# Patient Record
Sex: Male | Born: 1995 | Race: White | Hispanic: Yes
Health system: Southern US, Community
[De-identification: ages and names within clinical notes are randomized; demographics above are authoritative.]

---

## 2020-06-16 ENCOUNTER — Emergency Department (HOSPITAL_COMMUNITY): Payer: No Typology Code available for payment source

## 2020-06-16 ENCOUNTER — Emergency Department (HOSPITAL_COMMUNITY)
Admission: EM | Admit: 2020-06-16 | Discharge: 2020-06-16 | Disposition: A | Discharge status: Home / Self Care | Payer: No Typology Code available for payment source | Attending: Emergency Medicine | Admitting: Emergency Medicine

## 2020-06-16 DIAGNOSIS — S92301A Fracture of unspecified metatarsal bone(s), right foot, initial encounter for closed fracture: Secondary | ICD-10-CM

## 2020-06-16 DIAGNOSIS — Z23 Encounter for immunization: Secondary | ICD-10-CM | POA: Insufficient documentation

## 2020-06-16 DIAGNOSIS — R52 Pain, unspecified: Secondary | ICD-10-CM

## 2020-06-16 DIAGNOSIS — Y999 Unspecified external cause status: Secondary | ICD-10-CM | POA: Insufficient documentation

## 2020-06-16 DIAGNOSIS — S93124A Dislocation of metatarsophalangeal joint of right lesser toe(s), initial encounter: Secondary | ICD-10-CM | POA: Insufficient documentation

## 2020-06-16 DIAGNOSIS — S91312A Laceration without foreign body, left foot, initial encounter: Secondary | ICD-10-CM | POA: Diagnosis not present

## 2020-06-16 DIAGNOSIS — T1490XA Injury, unspecified, initial encounter: Secondary | ICD-10-CM

## 2020-06-16 DIAGNOSIS — Y939 Activity, unspecified: Secondary | ICD-10-CM | POA: Insufficient documentation

## 2020-06-16 DIAGNOSIS — Y929 Unspecified place or not applicable: Secondary | ICD-10-CM | POA: Diagnosis not present

## 2020-06-16 DIAGNOSIS — S92901A Unspecified fracture of right foot, initial encounter for closed fracture: Secondary | ICD-10-CM

## 2020-06-16 DIAGNOSIS — S93104A Unspecified dislocation of right toe(s), initial encounter: Secondary | ICD-10-CM

## 2020-06-16 DIAGNOSIS — S99912A Unspecified injury of left ankle, initial encounter: Secondary | ICD-10-CM | POA: Diagnosis present

## 2020-06-16 LAB — COMPREHENSIVE METABOLIC PANEL
ALT: 43 U/L (ref 0–44)
AST: 72 U/L — ABNORMAL HIGH (ref 15–41)
Albumin: 3.9 g/dL (ref 3.5–5.0)
Alkaline Phosphatase: 93 U/L (ref 38–126)
Anion gap: 12 (ref 5–15)
BUN: 12 mg/dL (ref 6–20)
CO2: 22 mmol/L (ref 22–32)
Calcium: 8.5 mg/dL — ABNORMAL LOW (ref 8.9–10.3)
Chloride: 105 mmol/L (ref 98–111)
Creatinine, Ser: 1.1 mg/dL (ref 0.61–1.24)
GFR calc Af Amer: >60 mL/min (ref >60)
GFR calc non Af Amer: >60 mL/min (ref >60)
Glucose, Bld: 105 mg/dL — ABNORMAL HIGH (ref 70–99)
Potassium: 3.6 mmol/L (ref 3.5–5.1)
Sodium: 139 mmol/L (ref 135–145)
Total Bilirubin: 0.6 mg/dL (ref 0.3–1.2)
Total Protein: 6.6 g/dL (ref 6.5–8.1)

## 2020-06-16 LAB — PROTIME-INR
INR: 1.1 (ref 0.8–1.2)
Prothrombin Time: 13.3 seconds (ref 11.4–15.2)

## 2020-06-16 LAB — CBC
HCT: 38.6 % — ABNORMAL LOW (ref 39.0–52.0)
Hemoglobin: 13.3 g/dL (ref 13.0–17.0)
MCH: 31.7 pg (ref 26.0–34.0)
MCHC: 34.5 g/dL (ref 30.0–36.0)
MCV: 92.1 fL (ref 80.0–100.0)
Platelets: 169 10*3/uL (ref 150–400)
RBC: 4.19 MIL/uL — ABNORMAL LOW (ref 4.22–5.81)
RDW: 11.9 % (ref 11.5–15.5)
WBC: 16.6 10*3/uL — ABNORMAL HIGH (ref 4.0–10.5)
nRBC: 0 % (ref 0.0–0.2)

## 2020-06-16 LAB — SAMPLE TO BLOOD BANK

## 2020-06-16 LAB — ETHANOL: Alcohol, Ethyl (B): 182 mg/dL — ABNORMAL HIGH (ref <10)

## 2020-06-16 MED ORDER — LIDOCAINE HCL (PF) 1 % IJ SOLN
30.0000 mL | Freq: Once | INTRAMUSCULAR | Status: AC
  Administered 2020-06-16: 30 mL
  Filled 2020-06-16: qty 30

## 2020-06-16 MED ORDER — IBUPROFEN 400 MG PO TABS
400.0000 mg | ORAL_TABLET | Freq: Once | ORAL | Status: AC
  Administered 2020-06-16: 400 mg via ORAL
  Filled 2020-06-16: qty 1

## 2020-06-16 MED ORDER — HYDROCODONE-ACETAMINOPHEN 5-325 MG PO TABS: 1.0000 | ORAL_TABLET | Freq: Four times a day (QID) | ORAL | 0 refills | Status: AC | PRN

## 2020-06-16 MED ORDER — FENTANYL CITRATE (PF) 100 MCG/2ML IJ SOLN
100.0000 ug | Freq: Once | INTRAMUSCULAR | Status: AC
  Administered 2020-06-16: 100 ug via INTRAVENOUS
  Filled 2020-06-16: qty 2

## 2020-06-16 MED ORDER — TETANUS-DIPHTH-ACELL PERTUSSIS 5-2.5-18.5 LF-MCG/0.5 IM SUSP
0.5000 mL | Freq: Once | INTRAMUSCULAR | Status: AC
  Administered 2020-06-16: 0.5 mL via INTRAMUSCULAR
  Filled 2020-06-16: qty 0.5

## 2020-06-16 MED ORDER — HYDROCODONE-ACETAMINOPHEN 5-325 MG PO TABS
1.0000 | ORAL_TABLET | Freq: Once | ORAL | Status: AC
  Administered 2020-06-16: 1 via ORAL
  Filled 2020-06-16: qty 1

## 2020-06-16 MED ORDER — IBUPROFEN 400 MG PO TABS: 400.0000 mg | ORAL_TABLET | Freq: Four times a day (QID) | ORAL | 0 refills | Status: AC | PRN

## 2020-06-16 MED ORDER — LIDOCAINE-EPINEPHRINE 1 %-1:100000 IJ SOLN
20.0000 mL | Freq: Once | INTRAMUSCULAR | Status: AC
  Administered 2020-06-16: 1 mL
  Filled 2020-06-16: qty 1

## 2020-06-16 NOTE — ED Provider Notes (Signed)
Blood pressure 122/70, pulse 99, temperature 99.2 F (37.3 C), temperature source Oral, resp. rate 14, SpO2 97 %.  Assuming care from Dr. Bebe Shaggy.  In short, Kenneth Walton is a 24 y.o. male with a chief complaint of No chief complaint on file. Marland Kitchen  Refer to the original H&P for additional details.  The current plan of care is to f/u on left foot plain film.  07:52 AM  Left foot plain film reviewed independently and Radiology interpretation reviewed. No acute findings. Plan for discharge with plan per Dr. Bebe Shaggy.     Maia Plan, MD 06/16/20 [phone]
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
## 2020-06-16 NOTE — ED Notes (Signed)
This RN received pt at 0300 from [employer]. I am unable to chart any information prior to 0300

## 2020-06-16 NOTE — Discharge Instructions (Signed)
Keep your splint clean and dry. Follow with the orthopedic surgery team in the coming week. Call tomorrow to schedule an appointment. Return to the ED with any new or suddenly worsening symptoms.

## 2020-06-16 NOTE — ED Notes (Signed)
Mother coming from [place] to pick him up

## 2020-06-16 NOTE — Progress Notes (Signed)
Orthopedic Tech Progress Note Patient Details:  Gohan Collister Dela Delma Officer [birth year] [id number]  Ortho Devices Type of Ortho Device: Crutches, Post (short leg) splint Ortho Device/Splint Location: rle Ortho Device/Splint Interventions: Ordered, Application, Adjustment   Post Interventions Instructions Provided: Care of device, Adjustment of device   Trinna Post 06/16/2020, 6:37 AM

## 2020-06-16 NOTE — ED Notes (Signed)
CBC is clotted and needs to be recollected per lab.

## 2020-06-21 ENCOUNTER — Encounter: Payer: Self-pay | Admitting: Orthopedic Surgery

## 2020-06-21 ENCOUNTER — Ambulatory Visit (INDEPENDENT_AMBULATORY_CARE_PROVIDER_SITE_OTHER): Payer: Self-pay

## 2020-06-21 ENCOUNTER — Ambulatory Visit (INDEPENDENT_AMBULATORY_CARE_PROVIDER_SITE_OTHER): Payer: Self-pay | Admitting: Orthopedic Surgery

## 2020-06-21 VITALS — Ht 69.0 in | Wt 135.0 lb

## 2020-06-21 DIAGNOSIS — M79671 Pain in right foot: Secondary | ICD-10-CM

## 2020-06-21 DIAGNOSIS — M25551 Pain in right hip: Secondary | ICD-10-CM
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
## 2020-07-05 ENCOUNTER — Ambulatory Visit (INDEPENDENT_AMBULATORY_CARE_PROVIDER_SITE_OTHER): Payer: Self-pay | Admitting: Surgical

## 2020-07-05 ENCOUNTER — Ambulatory Visit: Payer: Self-pay

## 2020-07-05 DIAGNOSIS — M79671 Pain in right foot: Secondary | ICD-10-CM

## 2020-07-05 DIAGNOSIS — S92301A Fracture of unspecified metatarsal bone(s), right foot, initial encounter for closed fracture: Secondary | ICD-10-CM

## 2020-07-05 MED ORDER — TRAMADOL HCL 50 MG PO TABS: 50.0000 mg | ORAL_TABLET | Freq: Two times a day (BID) | ORAL | 0 refills | Status: AC | PRN

## 2020-07-05 NOTE — Progress Notes (Signed)
   Post-Op Visit Note   Patient: Kenneth Walton           Date of Birth: 12-Dec-1995           MRN: [id number] Visit Date: 07/05/2020 PCP: Patient, No Pcp Per   Assessment & Plan:  Chief Complaint:  Chief Complaint  Patient presents with  . Right Foot - Follow-up   Visit Diagnoses:  1. Multiple closed fractures of metatarsal bone of right foot, initial encounter   2. Pain in right foot     Plan: Patient is a 24 year old male presents for reevaluation of second, third, fourth metatarsal fractures with first proximal phalanx fracture of the right foot.  He notes that he has improved over the last several weeks.  He has begun full weightbearing with the fracture boot as of 3 days ago.  He notes improving pain with every day even while weightbearing.  He is only taking Advil for pain relief.  Pain bothers him most at night.  On exam he does have swelling throughout the foot with tenderness most over the second, third, fourth metatarsals in the midshaft region.  No tenderness in the calf.  Negative Homans' sign.  No skin breakdown noted.  Radiographs of the right foot were reviewed and reveal no significant displacement of the fracture site since previous radiographs.  No callus formation on radiographs just yet.  Plan to continue full weightbearing in the boot.  Tramadol prescribed for pain control.  Follow-up in 2 weeks for clinical recheck.  Follow-Up Instructions: No follow-ups on file.   Orders:  Orders Placed This Encounter  Procedures  . XR Foot Complete Right   Meds ordered this encounter  Medications  . traMADol (ULTRAM) 50 MG tablet    Sig: Take 1 tablet (50 mg total) by mouth every 12 (twelve) hours as needed.    Dispense:  20 tablet    Refill:  0    Imaging: No results found.  PMFS History: There are no problems to display for this patient.  No past medical history on file.  No family history on file.  No past surgical history on file. Social History     Occupational History  . Not on file  Tobacco Use  . Smoking status: Never Smoker  . Smokeless tobacco: Never Used  Substance and Sexual Activity  . Alcohol use: Not on file  . Drug use: Not on file  . Sexual activity: Not on file

## 2020-07-24 ENCOUNTER — Telehealth: Payer: Self-pay

## 2020-07-24 ENCOUNTER — Encounter: Payer: Self-pay | Admitting: Orthopedic Surgery

## 2020-07-24 ENCOUNTER — Ambulatory Visit (INDEPENDENT_AMBULATORY_CARE_PROVIDER_SITE_OTHER): Payer: Self-pay | Admitting: Orthopedic Surgery

## 2020-07-24 ENCOUNTER — Ambulatory Visit: Payer: Self-pay

## 2020-07-24 DIAGNOSIS — S92301A Fracture of unspecified metatarsal bone(s), right foot, initial encounter for closed fracture: Secondary | ICD-10-CM
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
## 2020-07-29 NOTE — Progress Notes (Signed)
   Post-Op Visit Note   Patient: Kenneth Walton           Date of Birth: 18-Jul-1996           MRN: [id number] Visit Date: 07/24/2020 PCP: Patient, No Pcp Per   Assessment & Plan:  Chief Complaint:  Chief Complaint  Patient presents with  . Right Foot - Pain   Visit Diagnoses:  1. Multiple closed fractures of metatarsal bone of right foot, initial encounter     Plan: Patient is a 24 year old male who presents for reevaluation of multiple right foot fractures.  Patient notes that his pain is been steadily improving.  Is not taking anything for pain at this time.  He is currently weightbearing as tolerated in a fracture boot.  Doing well regarding the right foot pain.  Radiographs of the right foot were reviewed today and reveal callus formation.  Patient has reduced tenderness over the fracture sites.  Patient also notes left ankle pain.  Is tender over the deltoid ligament and the ATFL.  He had radiographs at the time of his injury that were negative for any acute pathology.  We will continue to monitor this and recheck at his next appointment.  Follow-up in 4 weeks for final check.  Recheck x-rays at that time.  Follow-Up Instructions: No follow-ups on file.   Orders:  Orders Placed This Encounter  Procedures  . XR Foot Complete Right   No orders of the defined types were placed in this encounter.   Imaging: No results found.  PMFS History: There are no problems to display for this patient.  History reviewed. No pertinent past medical history.  History reviewed. No pertinent family history.  History reviewed. No pertinent surgical history. Social History   Occupational History  . Not on file  Tobacco Use  . Smoking status: Never Smoker  . Smokeless tobacco: Never Used  Substance and Sexual Activity  . Alcohol use: Not on file  . Drug use: Not on file  . Sexual activity: Not on file

## 2020-08-21 ENCOUNTER — Ambulatory Visit: Payer: Self-pay

## 2020-08-21 ENCOUNTER — Ambulatory Visit (INDEPENDENT_AMBULATORY_CARE_PROVIDER_SITE_OTHER): Payer: Self-pay | Admitting: Orthopedic Surgery

## 2020-08-21 ENCOUNTER — Encounter: Payer: Self-pay | Admitting: Orthopedic Surgery

## 2020-08-21 DIAGNOSIS — M25551 Pain in right hip: Secondary | ICD-10-CM

## 2020-08-21 DIAGNOSIS — S92301A Fracture of unspecified metatarsal bone(s), right foot, initial encounter for closed fracture: Secondary | ICD-10-CM
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
## 2020-09-20 ENCOUNTER — Ambulatory Visit: Payer: Self-pay

## 2020-09-20 ENCOUNTER — Ambulatory Visit (INDEPENDENT_AMBULATORY_CARE_PROVIDER_SITE_OTHER): Payer: Self-pay | Admitting: Orthopedic Surgery

## 2020-09-20 DIAGNOSIS — M25571 Pain in right ankle and joints of right foot: Secondary | ICD-10-CM

## 2020-09-21 ENCOUNTER — Encounter: Payer: Self-pay | Admitting: Orthopedic Surgery
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
